# Patient Record
Sex: Male | Born: 2000 | Race: White | Hispanic: No | Marital: Single | Smoking: Never smoker
Health system: Southern US, Community
[De-identification: ages and names within clinical notes are randomized; demographics above are authoritative.]

## PROBLEM LIST (undated history)

## (undated) DIAGNOSIS — G43909 Migraine, unspecified, not intractable, without status migrainosus: Secondary | ICD-10-CM

---
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
## 2022-02-03 ENCOUNTER — Other Ambulatory Visit (HOSPITAL_BASED_OUTPATIENT_CLINIC_OR_DEPARTMENT_OTHER): Payer: Self-pay

## 2022-02-03 MED ORDER — MIRTAZAPINE 15 MG PO TABS
15.0000 mg | ORAL_TABLET | Freq: Every day | ORAL | 0 refills | Status: DC
  Filled 2022-02-03: qty 30, 30d supply, fill #0

## 2022-02-03 MED ORDER — FLUOXETINE HCL 20 MG PO CAPS
ORAL_CAPSULE | ORAL | 0 refills | Status: DC
  Filled 2022-02-03: qty 30, 15d supply, fill #0

## 2022-02-26 ENCOUNTER — Other Ambulatory Visit (HOSPITAL_BASED_OUTPATIENT_CLINIC_OR_DEPARTMENT_OTHER): Payer: Self-pay

## 2022-02-26 MED ORDER — METHYLPHENIDATE HCL 10 MG PO TABS
ORAL_TABLET | ORAL | 0 refills | Status: AC
  Filled 2022-02-26: qty 60, 30d supply, fill #0

## 2022-02-26 MED ORDER — FLUOXETINE HCL 40 MG PO CAPS
ORAL_CAPSULE | ORAL | 0 refills | Status: AC
  Filled 2022-02-26: qty 90, 90d supply, fill #0

## 2022-03-03 ENCOUNTER — Other Ambulatory Visit (HOSPITAL_BASED_OUTPATIENT_CLINIC_OR_DEPARTMENT_OTHER): Payer: Self-pay

## 2022-03-03 MED ORDER — MIRTAZAPINE 15 MG PO TABS
15.0000 mg | ORAL_TABLET | Freq: Every day | ORAL | 0 refills | Status: AC
  Filled 2022-03-03: qty 30, 30d supply, fill #0

## 2022-04-22 ENCOUNTER — Other Ambulatory Visit (HOSPITAL_BASED_OUTPATIENT_CLINIC_OR_DEPARTMENT_OTHER): Payer: Self-pay

## 2022-04-22 MED ORDER — MIRTAZAPINE 30 MG PO TABS
30.0000 mg | ORAL_TABLET | Freq: Every day | ORAL | 0 refills | Status: DC
  Filled 2022-04-22: qty 30, 30d supply, fill #0

## 2022-04-22 MED ORDER — FLUOXETINE HCL 10 MG PO TABS
ORAL_TABLET | ORAL | 0 refills | Status: AC
  Filled 2022-04-22: qty 30, 30d supply, fill #0

## 2022-04-22 MED ORDER — FLUOXETINE HCL 20 MG PO CAPS
20.0000 mg | ORAL_CAPSULE | Freq: Every day | ORAL | 0 refills | Status: AC
  Filled 2022-04-22: qty 30, 30d supply, fill #0

## 2022-05-05 ENCOUNTER — Other Ambulatory Visit (HOSPITAL_BASED_OUTPATIENT_CLINIC_OR_DEPARTMENT_OTHER): Payer: Self-pay

## 2022-06-10 ENCOUNTER — Other Ambulatory Visit (HOSPITAL_BASED_OUTPATIENT_CLINIC_OR_DEPARTMENT_OTHER): Payer: Self-pay

## 2022-06-10 MED ORDER — FLUOXETINE HCL 40 MG PO CAPS
40.0000 mg | ORAL_CAPSULE | Freq: Every morning | ORAL | 0 refills | Status: AC
  Filled 2022-06-10: qty 90, 90d supply, fill #0

## 2022-06-11 ENCOUNTER — Other Ambulatory Visit (HOSPITAL_BASED_OUTPATIENT_CLINIC_OR_DEPARTMENT_OTHER): Payer: Self-pay

## 2022-06-11 MED ORDER — METHYLPHENIDATE HCL ER (LA) 10 MG PO CP24
10.0000 mg | ORAL_CAPSULE | Freq: Every morning | ORAL | 0 refills | Status: AC
  Filled 2022-06-11: qty 30, 30d supply, fill #0

## 2022-06-11 MED ORDER — MIRTAZAPINE 30 MG PO TABS
30.0000 mg | ORAL_TABLET | Freq: Every day | ORAL | 1 refills | Status: AC
  Filled 2022-06-11: qty 90, 90d supply, fill #0
  Filled 2022-10-02 – 2022-10-03 (×2): qty 90, 90d supply, fill #1

## 2022-06-11 MED ORDER — METHYLPHENIDATE HCL ER (LA) 10 MG PO CP24: 10.0000 mg | ORAL_CAPSULE | Freq: Every morning | ORAL | 0 refills | Status: AC

## 2022-06-11 MED ORDER — FLUOXETINE HCL 20 MG PO CAPS
20.0000 mg | ORAL_CAPSULE | Freq: Every morning | ORAL | 1 refills | Status: AC
  Filled 2022-06-11: qty 60, 60d supply, fill #0
  Filled 2022-08-18 (×2): qty 60, 60d supply, fill #1

## 2022-06-12 ENCOUNTER — Other Ambulatory Visit (HOSPITAL_BASED_OUTPATIENT_CLINIC_OR_DEPARTMENT_OTHER): Payer: Self-pay

## 2022-06-13 ENCOUNTER — Other Ambulatory Visit (HOSPITAL_BASED_OUTPATIENT_CLINIC_OR_DEPARTMENT_OTHER): Payer: Self-pay

## 2022-08-06 ENCOUNTER — Other Ambulatory Visit (HOSPITAL_BASED_OUTPATIENT_CLINIC_OR_DEPARTMENT_OTHER): Payer: Self-pay

## 2022-08-06 MED ORDER — METHYLPHENIDATE HCL 10 MG PO TABS
10.0000 mg | ORAL_TABLET | Freq: Every day | ORAL | 0 refills | Status: AC | PRN
  Filled 2022-08-06: qty 30, 30d supply, fill #0

## 2022-08-06 MED ORDER — MIRTAZAPINE 45 MG PO TABS
45.0000 mg | ORAL_TABLET | Freq: Every evening | ORAL | 1 refills | Status: DC
  Filled 2022-08-06: qty 30, 30d supply, fill #0
  Filled 2022-12-04 – 2023-01-15 (×3): qty 30, 30d supply, fill #1
  Filled 2023-02-24 (×2): qty 30, 30d supply, fill #2

## 2022-08-06 MED ORDER — METHYLPHENIDATE HCL 10 MG PO TABS: 10.0000 mg | ORAL_TABLET | Freq: Every day | ORAL | 0 refills | Status: AC | PRN

## 2022-08-07 ENCOUNTER — Other Ambulatory Visit (HOSPITAL_BASED_OUTPATIENT_CLINIC_OR_DEPARTMENT_OTHER): Payer: Self-pay

## 2022-08-18 ENCOUNTER — Other Ambulatory Visit (HOSPITAL_BASED_OUTPATIENT_CLINIC_OR_DEPARTMENT_OTHER): Payer: Self-pay

## 2022-10-03 ENCOUNTER — Other Ambulatory Visit (HOSPITAL_BASED_OUTPATIENT_CLINIC_OR_DEPARTMENT_OTHER): Payer: Self-pay

## 2022-10-03 ENCOUNTER — Other Ambulatory Visit: Payer: Self-pay

## 2022-12-15 ENCOUNTER — Other Ambulatory Visit (HOSPITAL_BASED_OUTPATIENT_CLINIC_OR_DEPARTMENT_OTHER): Payer: Self-pay

## 2023-01-15 ENCOUNTER — Other Ambulatory Visit (HOSPITAL_BASED_OUTPATIENT_CLINIC_OR_DEPARTMENT_OTHER): Payer: Self-pay

## 2023-02-24 ENCOUNTER — Other Ambulatory Visit (HOSPITAL_BASED_OUTPATIENT_CLINIC_OR_DEPARTMENT_OTHER): Payer: Self-pay

## 2023-02-24 ENCOUNTER — Other Ambulatory Visit: Payer: Self-pay

## 2023-02-24 ENCOUNTER — Other Ambulatory Visit (HOSPITAL_COMMUNITY): Payer: Self-pay

## 2023-03-02 ENCOUNTER — Other Ambulatory Visit (HOSPITAL_BASED_OUTPATIENT_CLINIC_OR_DEPARTMENT_OTHER): Payer: Self-pay

## 2023-03-31 ENCOUNTER — Other Ambulatory Visit (HOSPITAL_BASED_OUTPATIENT_CLINIC_OR_DEPARTMENT_OTHER): Payer: Self-pay

## 2023-03-31 MED ORDER — MIRTAZAPINE 45 MG PO TABS
45.0000 mg | ORAL_TABLET | Freq: Every day | ORAL | 0 refills | Status: DC
  Filled 2023-03-31: qty 45, 45d supply, fill #0

## 2023-04-15 ENCOUNTER — Other Ambulatory Visit (HOSPITAL_BASED_OUTPATIENT_CLINIC_OR_DEPARTMENT_OTHER): Payer: Self-pay

## 2023-04-15 MED ORDER — FLUOXETINE HCL 20 MG PO CAPS
20.0000 mg | ORAL_CAPSULE | Freq: Every morning | ORAL | 0 refills | Status: AC
  Filled 2023-04-15: qty 30, 30d supply, fill #0

## 2023-04-17 ENCOUNTER — Other Ambulatory Visit (HOSPITAL_BASED_OUTPATIENT_CLINIC_OR_DEPARTMENT_OTHER): Payer: Self-pay

## 2023-04-20 ENCOUNTER — Other Ambulatory Visit (HOSPITAL_BASED_OUTPATIENT_CLINIC_OR_DEPARTMENT_OTHER): Payer: Self-pay

## 2023-04-22 DIAGNOSIS — F411 Generalized anxiety disorder: Secondary | ICD-10-CM | POA: Diagnosis not present

## 2023-04-22 DIAGNOSIS — F331 Major depressive disorder, recurrent, moderate: Secondary | ICD-10-CM | POA: Diagnosis not present

## 2023-04-23 ENCOUNTER — Other Ambulatory Visit (HOSPITAL_BASED_OUTPATIENT_CLINIC_OR_DEPARTMENT_OTHER): Payer: Self-pay

## 2023-04-23 MED ORDER — VILAZODONE HCL 10 MG PO TABS
10.0000 mg | ORAL_TABLET | Freq: Every day | ORAL | 0 refills | Status: AC
  Filled 2023-04-23: qty 90, 90d supply, fill #0

## 2023-05-08 DIAGNOSIS — T50916A Underdosing of multiple unspecified drugs, medicaments and biological substances, initial encounter: Secondary | ICD-10-CM | POA: Diagnosis not present

## 2023-05-08 DIAGNOSIS — F32A Depression, unspecified: Secondary | ICD-10-CM | POA: Diagnosis not present

## 2023-05-08 DIAGNOSIS — G4700 Insomnia, unspecified: Secondary | ICD-10-CM | POA: Diagnosis not present

## 2023-05-08 DIAGNOSIS — F419 Anxiety disorder, unspecified: Secondary | ICD-10-CM | POA: Diagnosis not present

## 2023-05-08 DIAGNOSIS — Z91148 Patient's other noncompliance with medication regimen for other reason: Secondary | ICD-10-CM | POA: Diagnosis not present

## 2023-05-20 DIAGNOSIS — F411 Generalized anxiety disorder: Secondary | ICD-10-CM | POA: Diagnosis not present

## 2023-05-20 DIAGNOSIS — F331 Major depressive disorder, recurrent, moderate: Secondary | ICD-10-CM | POA: Diagnosis not present

## 2023-05-21 ENCOUNTER — Other Ambulatory Visit (HOSPITAL_BASED_OUTPATIENT_CLINIC_OR_DEPARTMENT_OTHER): Payer: Self-pay

## 2023-05-21 MED ORDER — VILAZODONE HCL 20 MG PO TABS
20.0000 mg | ORAL_TABLET | Freq: Every day | ORAL | 3 refills | Status: DC
  Filled 2023-05-21: qty 90, 90d supply, fill #0
  Filled 2023-10-19: qty 90, 90d supply, fill #1
  Filled 2024-01-20: qty 90, 90d supply, fill #2
  Filled 2024-04-30: qty 30, 30d supply, fill #3

## 2023-05-21 MED ORDER — MIRTAZAPINE 45 MG PO TABS
45.0000 mg | ORAL_TABLET | Freq: Every day | ORAL | 3 refills | Status: DC
  Filled 2023-05-21: qty 90, 90d supply, fill #0
  Filled 2023-08-31: qty 90, 90d supply, fill #1
  Filled 2023-12-03: qty 90, 90d supply, fill #2
  Filled 2024-03-08: qty 90, 90d supply, fill #3

## 2023-07-16 DIAGNOSIS — F411 Generalized anxiety disorder: Secondary | ICD-10-CM | POA: Diagnosis not present

## 2023-07-16 DIAGNOSIS — F3341 Major depressive disorder, recurrent, in partial remission: Secondary | ICD-10-CM | POA: Diagnosis not present

## 2023-07-27 ENCOUNTER — Other Ambulatory Visit: Payer: Self-pay | Admitting: Family Medicine

## 2023-07-27 ENCOUNTER — Ambulatory Visit: Payer: Self-pay

## 2023-07-27 DIAGNOSIS — M25562 Pain in left knee: Secondary | ICD-10-CM

## 2023-09-01 ENCOUNTER — Other Ambulatory Visit: Payer: Self-pay

## 2023-09-30 DIAGNOSIS — Z0000 Encounter for general adult medical examination without abnormal findings: Secondary | ICD-10-CM | POA: Diagnosis not present

## 2023-09-30 DIAGNOSIS — R030 Elevated blood-pressure reading, without diagnosis of hypertension: Secondary | ICD-10-CM | POA: Diagnosis not present

## 2023-09-30 DIAGNOSIS — R197 Diarrhea, unspecified: Secondary | ICD-10-CM | POA: Diagnosis not present

## 2023-09-30 DIAGNOSIS — R61 Generalized hyperhidrosis: Secondary | ICD-10-CM | POA: Diagnosis not present

## 2023-09-30 DIAGNOSIS — R5383 Other fatigue: Secondary | ICD-10-CM | POA: Diagnosis not present

## 2023-09-30 DIAGNOSIS — F419 Anxiety disorder, unspecified: Secondary | ICD-10-CM | POA: Diagnosis not present

## 2023-10-08 DIAGNOSIS — R5383 Other fatigue: Secondary | ICD-10-CM | POA: Diagnosis not present

## 2023-10-08 DIAGNOSIS — Z0000 Encounter for general adult medical examination without abnormal findings: Secondary | ICD-10-CM | POA: Diagnosis not present

## 2023-10-08 DIAGNOSIS — Z1159 Encounter for screening for other viral diseases: Secondary | ICD-10-CM | POA: Diagnosis not present

## 2023-10-08 DIAGNOSIS — F419 Anxiety disorder, unspecified: Secondary | ICD-10-CM | POA: Diagnosis not present

## 2023-10-13 ENCOUNTER — Other Ambulatory Visit (HOSPITAL_BASED_OUTPATIENT_CLINIC_OR_DEPARTMENT_OTHER): Payer: Self-pay

## 2023-10-13 MED ORDER — DRYSOL 20 % EX SOLN
CUTANEOUS | 3 refills | Status: AC
  Filled 2023-10-13: qty 35, 30d supply, fill #0

## 2023-10-19 ENCOUNTER — Other Ambulatory Visit (HOSPITAL_BASED_OUTPATIENT_CLINIC_OR_DEPARTMENT_OTHER): Payer: Self-pay

## 2024-04-30 ENCOUNTER — Other Ambulatory Visit (HOSPITAL_BASED_OUTPATIENT_CLINIC_OR_DEPARTMENT_OTHER): Payer: Self-pay

## 2024-05-02 ENCOUNTER — Other Ambulatory Visit (HOSPITAL_BASED_OUTPATIENT_CLINIC_OR_DEPARTMENT_OTHER): Payer: Self-pay

## 2024-05-03 ENCOUNTER — Other Ambulatory Visit (HOSPITAL_BASED_OUTPATIENT_CLINIC_OR_DEPARTMENT_OTHER): Payer: Self-pay

## 2024-05-04 ENCOUNTER — Other Ambulatory Visit (HOSPITAL_BASED_OUTPATIENT_CLINIC_OR_DEPARTMENT_OTHER): Payer: Self-pay

## 2024-06-07 ENCOUNTER — Other Ambulatory Visit (HOSPITAL_BASED_OUTPATIENT_CLINIC_OR_DEPARTMENT_OTHER): Payer: Self-pay

## 2024-06-07 MED ORDER — MIRTAZAPINE 45 MG PO TABS
45.0000 mg | ORAL_TABLET | Freq: Every day | ORAL | 0 refills | Status: AC
Start: 2024-06-07 — End: ?
  Filled 2024-06-07: qty 90, 90d supply, fill #0

## 2024-06-07 MED ORDER — VILAZODONE HCL 20 MG PO TABS
20.0000 mg | ORAL_TABLET | Freq: Every day | ORAL | 0 refills | Status: DC
  Filled 2024-06-07: qty 30, 30d supply, fill #0
  Filled 2024-07-07: qty 30, 30d supply, fill #1
  Filled 2024-08-04 – 2024-08-05 (×2): qty 30, 30d supply, fill #2

## 2024-07-07 ENCOUNTER — Other Ambulatory Visit (HOSPITAL_BASED_OUTPATIENT_CLINIC_OR_DEPARTMENT_OTHER): Payer: Self-pay

## 2024-07-07 ENCOUNTER — Other Ambulatory Visit: Payer: Self-pay

## 2024-08-05 ENCOUNTER — Other Ambulatory Visit (HOSPITAL_BASED_OUTPATIENT_CLINIC_OR_DEPARTMENT_OTHER): Payer: Self-pay

## 2024-09-01 ENCOUNTER — Other Ambulatory Visit (HOSPITAL_BASED_OUTPATIENT_CLINIC_OR_DEPARTMENT_OTHER): Payer: Self-pay

## 2024-09-01 ENCOUNTER — Other Ambulatory Visit: Payer: Self-pay

## 2024-09-01 MED ORDER — VILAZODONE HCL 20 MG PO TABS
20.0000 mg | ORAL_TABLET | Freq: Every day | ORAL | 2 refills | Status: AC
  Filled 2024-09-01: qty 30, 30d supply, fill #0
  Filled 2024-10-03 (×2): qty 30, 30d supply, fill #1

## 2024-09-03 ENCOUNTER — Other Ambulatory Visit (HOSPITAL_BASED_OUTPATIENT_CLINIC_OR_DEPARTMENT_OTHER): Payer: Self-pay

## 2024-09-07 ENCOUNTER — Other Ambulatory Visit (HOSPITAL_BASED_OUTPATIENT_CLINIC_OR_DEPARTMENT_OTHER): Payer: Self-pay

## 2024-09-07 MED ORDER — MIRTAZAPINE 45 MG PO TABS
45.0000 mg | ORAL_TABLET | Freq: Every day | ORAL | 2 refills | Status: AC
  Filled 2024-09-07 (×2): qty 30, 30d supply, fill #0
  Filled 2024-10-09: qty 30, 30d supply, fill #1

## 2024-09-08 ENCOUNTER — Other Ambulatory Visit: Payer: Self-pay

## 2024-10-03 ENCOUNTER — Other Ambulatory Visit (HOSPITAL_BASED_OUTPATIENT_CLINIC_OR_DEPARTMENT_OTHER): Payer: Self-pay

## 2024-10-06 ENCOUNTER — Other Ambulatory Visit (HOSPITAL_COMMUNITY): Payer: Self-pay
